# Patient Record
Sex: Male | Born: 2000
Health system: Southern US, Community
[De-identification: ages and names within clinical notes are randomized; demographics above are authoritative.]

---
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
## 2022-02-07 ENCOUNTER — Ambulatory Visit: Payer: 59 | Admitting: Internal Medicine
